# Patient Record
Sex: Male | Born: 1970 | Race: Black or African American | Hispanic: No | Marital: Married | Smoking: Never smoker
Health system: Southern US, Community
[De-identification: ages and names within clinical notes are randomized; demographics above are authoritative.]

## PROBLEM LIST (undated history)

## (undated) DIAGNOSIS — I10 Essential (primary) hypertension: Secondary | ICD-10-CM

## (undated) DIAGNOSIS — J45909 Unspecified asthma, uncomplicated: Secondary | ICD-10-CM

## (undated) HISTORY — PX: FRACTURE SURGERY: SHX138

## (undated) HISTORY — DX: Essential (primary) hypertension: I10

## (undated) HISTORY — PX: INGUINAL HERNIA REPAIR: SUR1180

---

## 2004-09-22 ENCOUNTER — Emergency Department: Payer: Self-pay | Admitting: Emergency Medicine

## 2005-10-03 ENCOUNTER — Emergency Department: Payer: Self-pay | Admitting: Plastic Surgery

## 2005-10-23 ENCOUNTER — Emergency Department: Payer: Self-pay | Admitting: Unknown Physician Specialty

## 2005-11-06 ENCOUNTER — Ambulatory Visit: Payer: Self-pay | Admitting: Unknown Physician Specialty

## 2006-12-23 ENCOUNTER — Emergency Department: Payer: Self-pay | Admitting: General Practice

## 2007-08-17 IMAGING — CT CT ABD-PELV W/O CM
1 of 2 series · 16 of 32 positions shown, 20 images · non-contrast
Comparison: none

REASON FOR EXAM: RLQ PAIN
COMMENTS:

PROCEDURE:     CT  - CT ABDOMEN AND PELVIS W[DATE] [DATE]
RESULT:
HISTORY: RIGHT lower quadrant pain.

[Series 2: ab wo · axial · 0.82mm/px · z∈[-439,-34]mm · 16 of 149 slices shown, 20 images]
[im 7/149  soft-tissue]
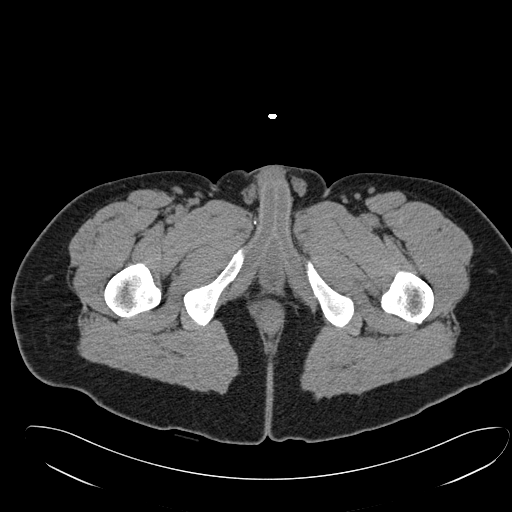
[im 7/149  bone]
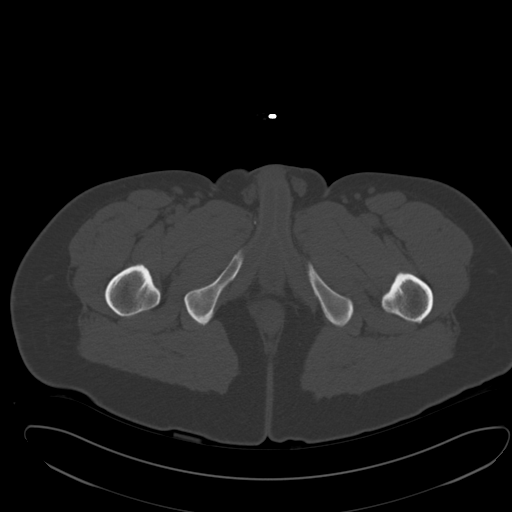
[im 19/149  soft-tissue]
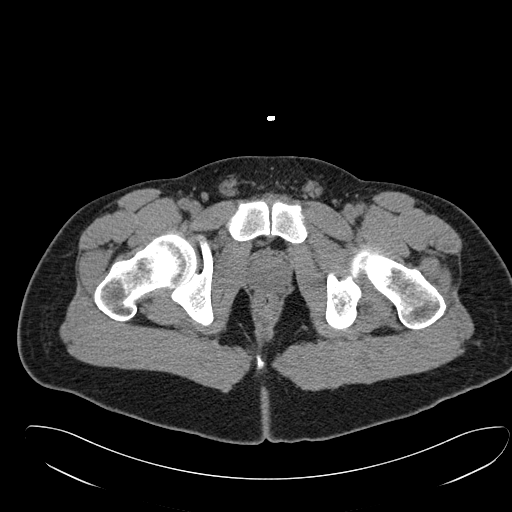
[im 31/149  soft-tissue]
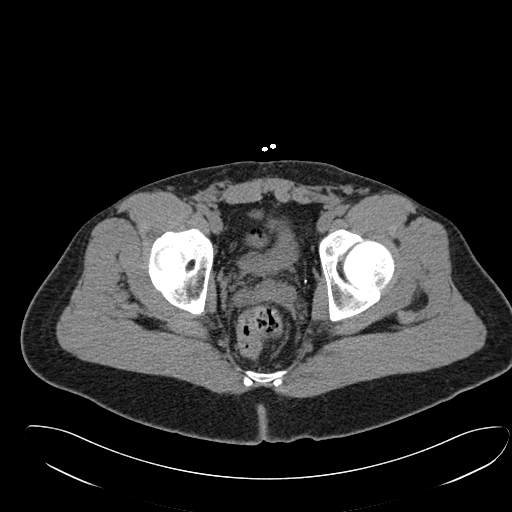
[im 38/149  soft-tissue]
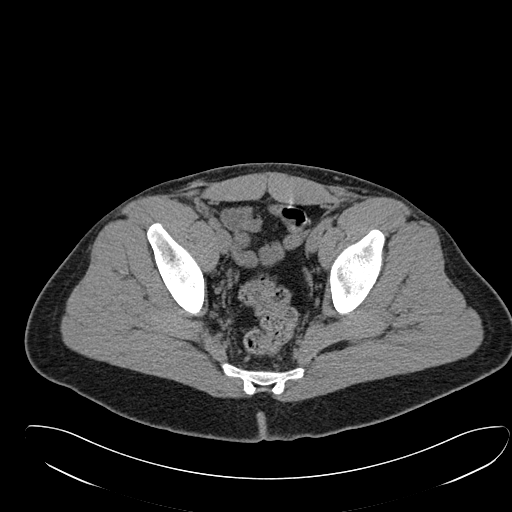
[im 50/149  soft-tissue]
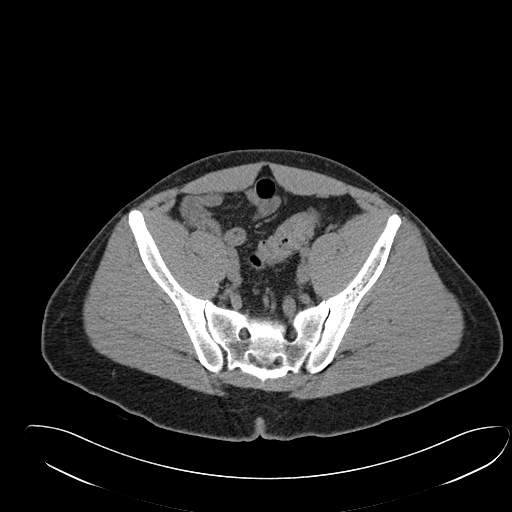
[im 62/149  soft-tissue]
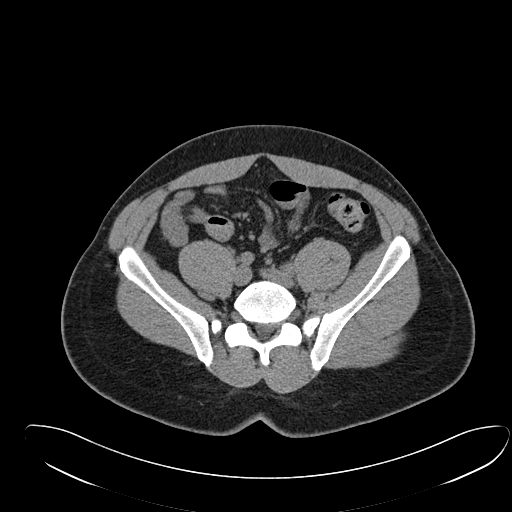
[im 68/149  soft-tissue]
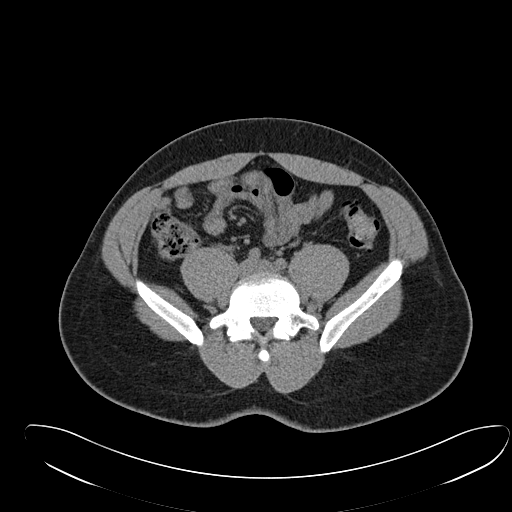
[im 81/149  soft-tissue]
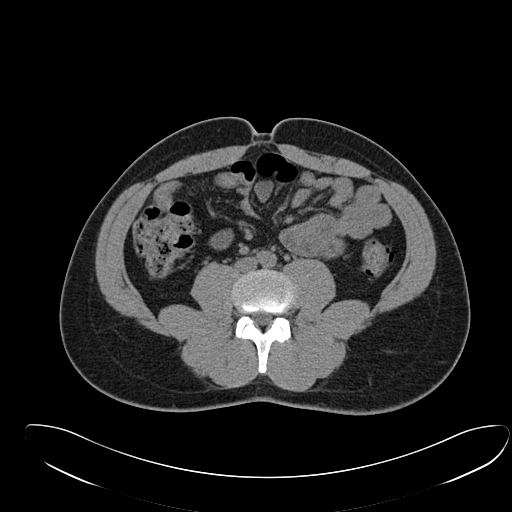
[im 87/149  soft-tissue]
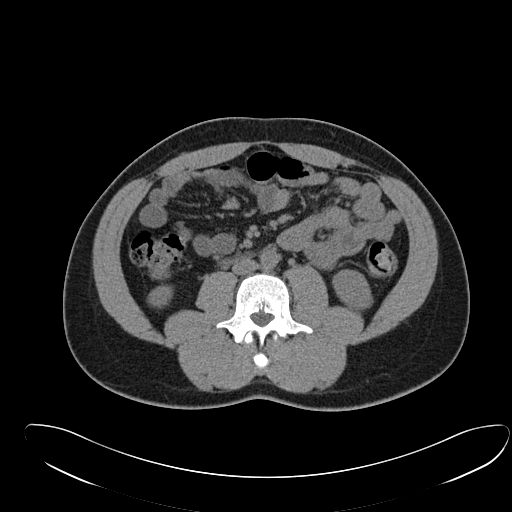
[im 87/149  bone]
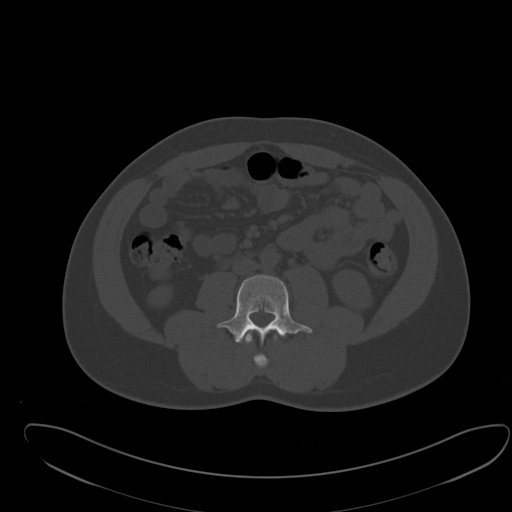
[im 99/149  soft-tissue]
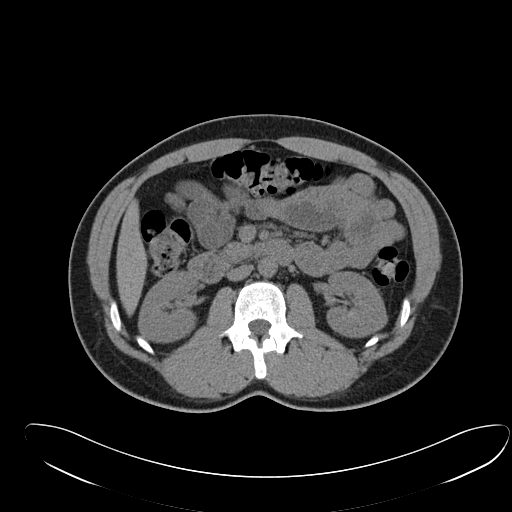
[im 112/149  soft-tissue]
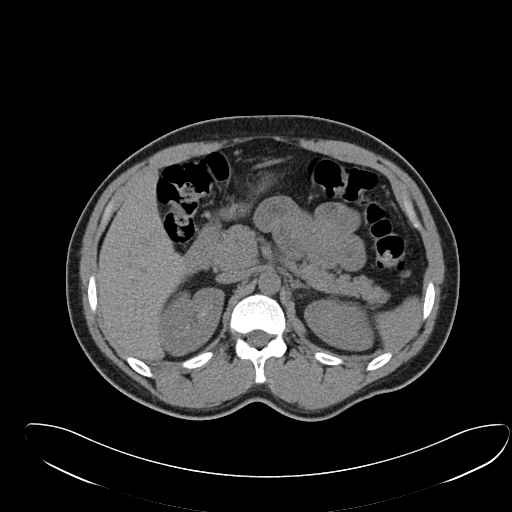
[im 118/149  soft-tissue]
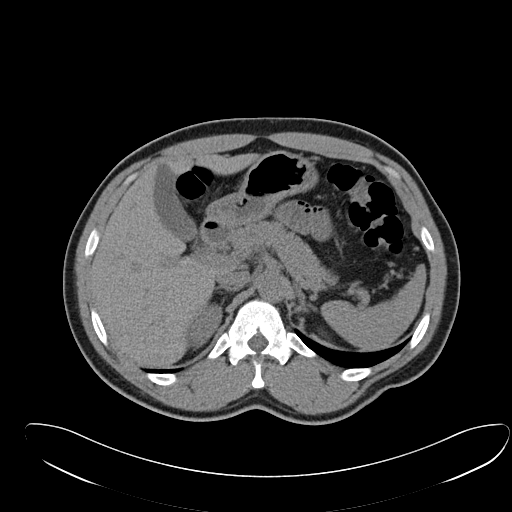
[im 124/149  lung]
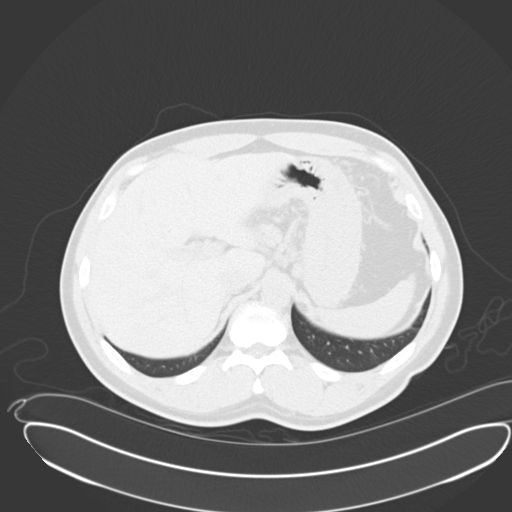
[im 130/149  soft-tissue]
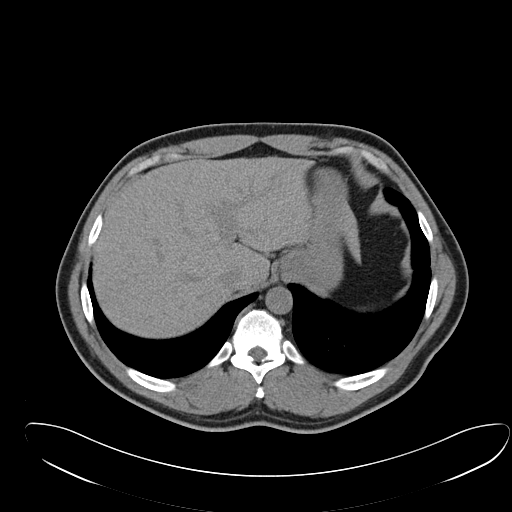
[im 130/149  lung]
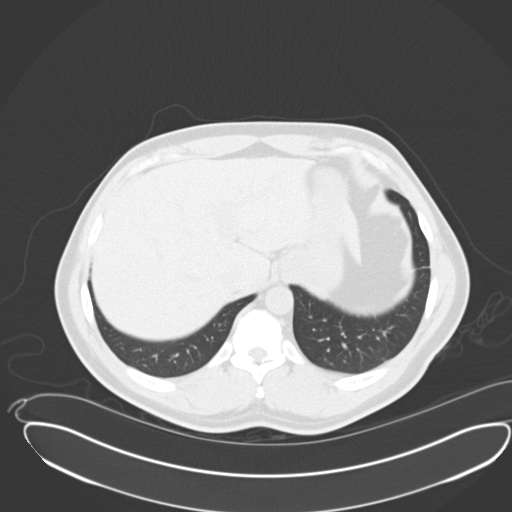
[im 136/149  lung]
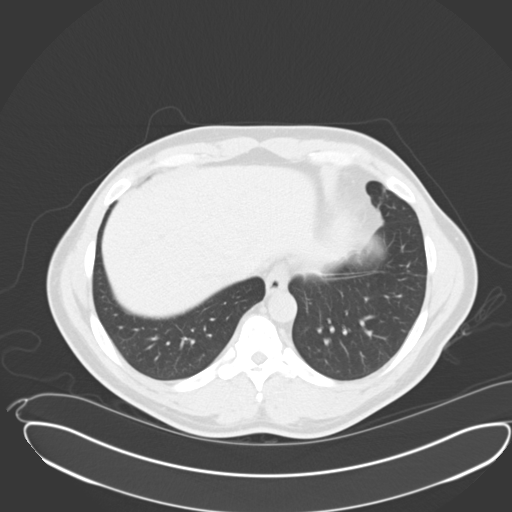
[im 142/149  soft-tissue]
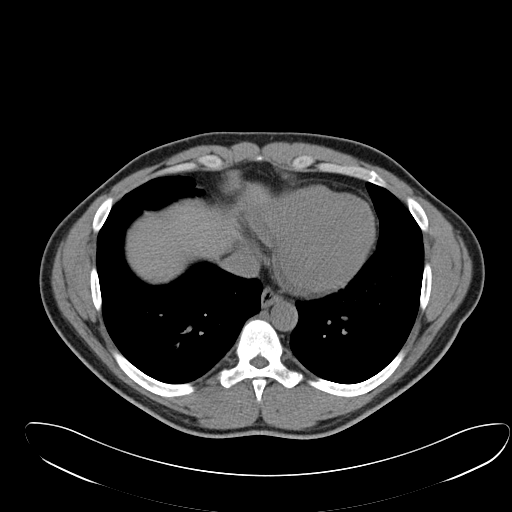
[im 142/149  lung]
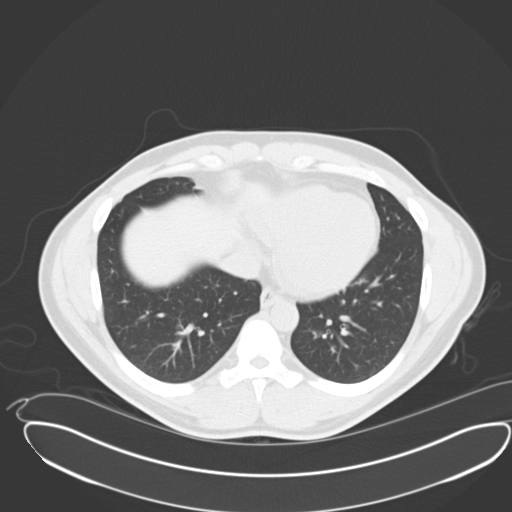

[16 of 32 positions shown; findings below may reference images not displayed]

FINDINGS: The liver and spleen are normal.  The pancreas is normal.  The
adrenals are normal.  Bilateral renal medullary mild calcification is noted.
 LEFT renal nonobstructing calculus is noted. No evidence of ureteral stone.
 The abdominal aorta is normal.  RIGHT lower quadrant is unremarkable.  No
inguinal adenopathy is noted. Calcified pelvic phleboliths are noted.  The
bladder is nondistended.  The lung bases are clear. The appendix is not well
visualized.  The RIGHT lower quadrant, however, is unremarkable.
IMPRESSION: Nonspecific exam.

## 2008-05-22 ENCOUNTER — Emergency Department: Payer: Self-pay | Admitting: Emergency Medicine

## 2009-01-14 IMAGING — CR DG CHEST 2V
1 series · 2 of 2 positions shown · non-contrast
Comparison: none

REASON FOR EXAM: Chest Pain
COMMENTS:

PROCEDURE:     DXR - DXR CHEST PA (OR AP) AND LATERAL  - May 22, 2008 [DATE]
RESULT:     The lung fields are clear. The heart, mediastinal and osseous
structures show no acute changes.

[Series 1: view not recorded · 0.17mm/px · 2 of 2 slices shown]
[im 1/2]
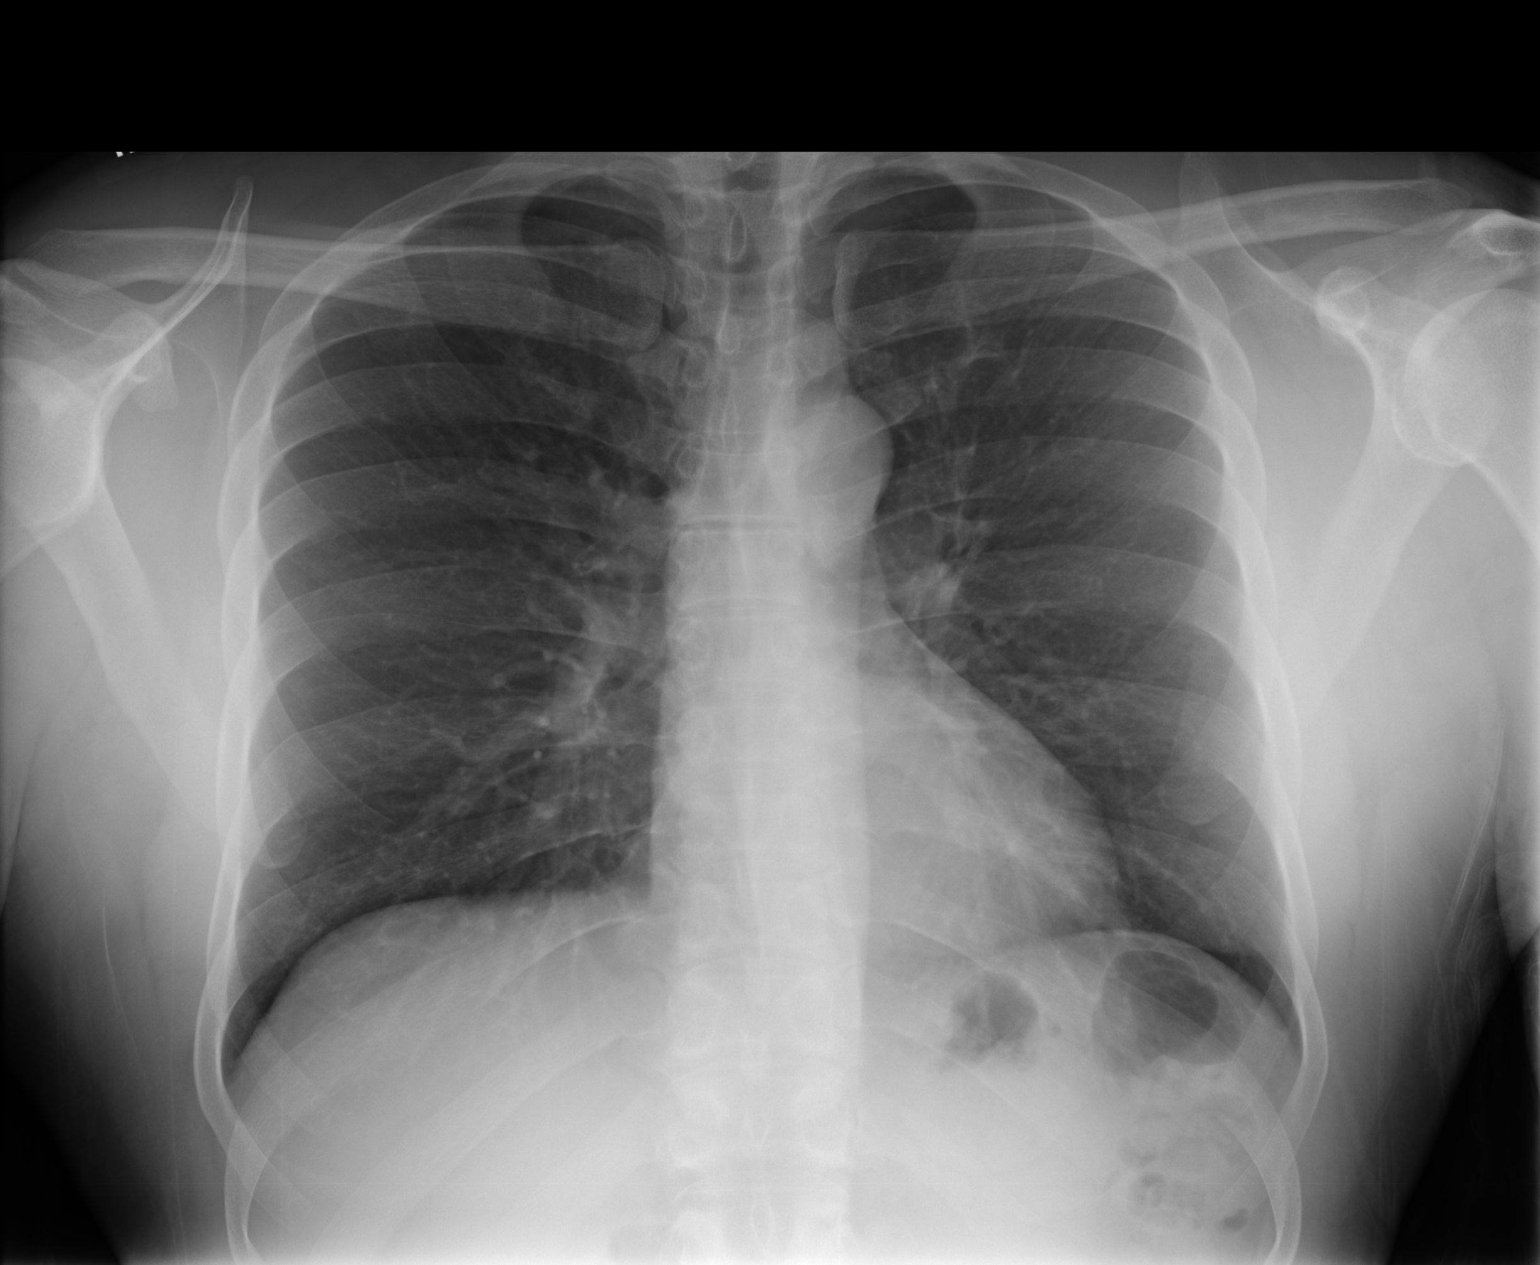
[im 2/2]
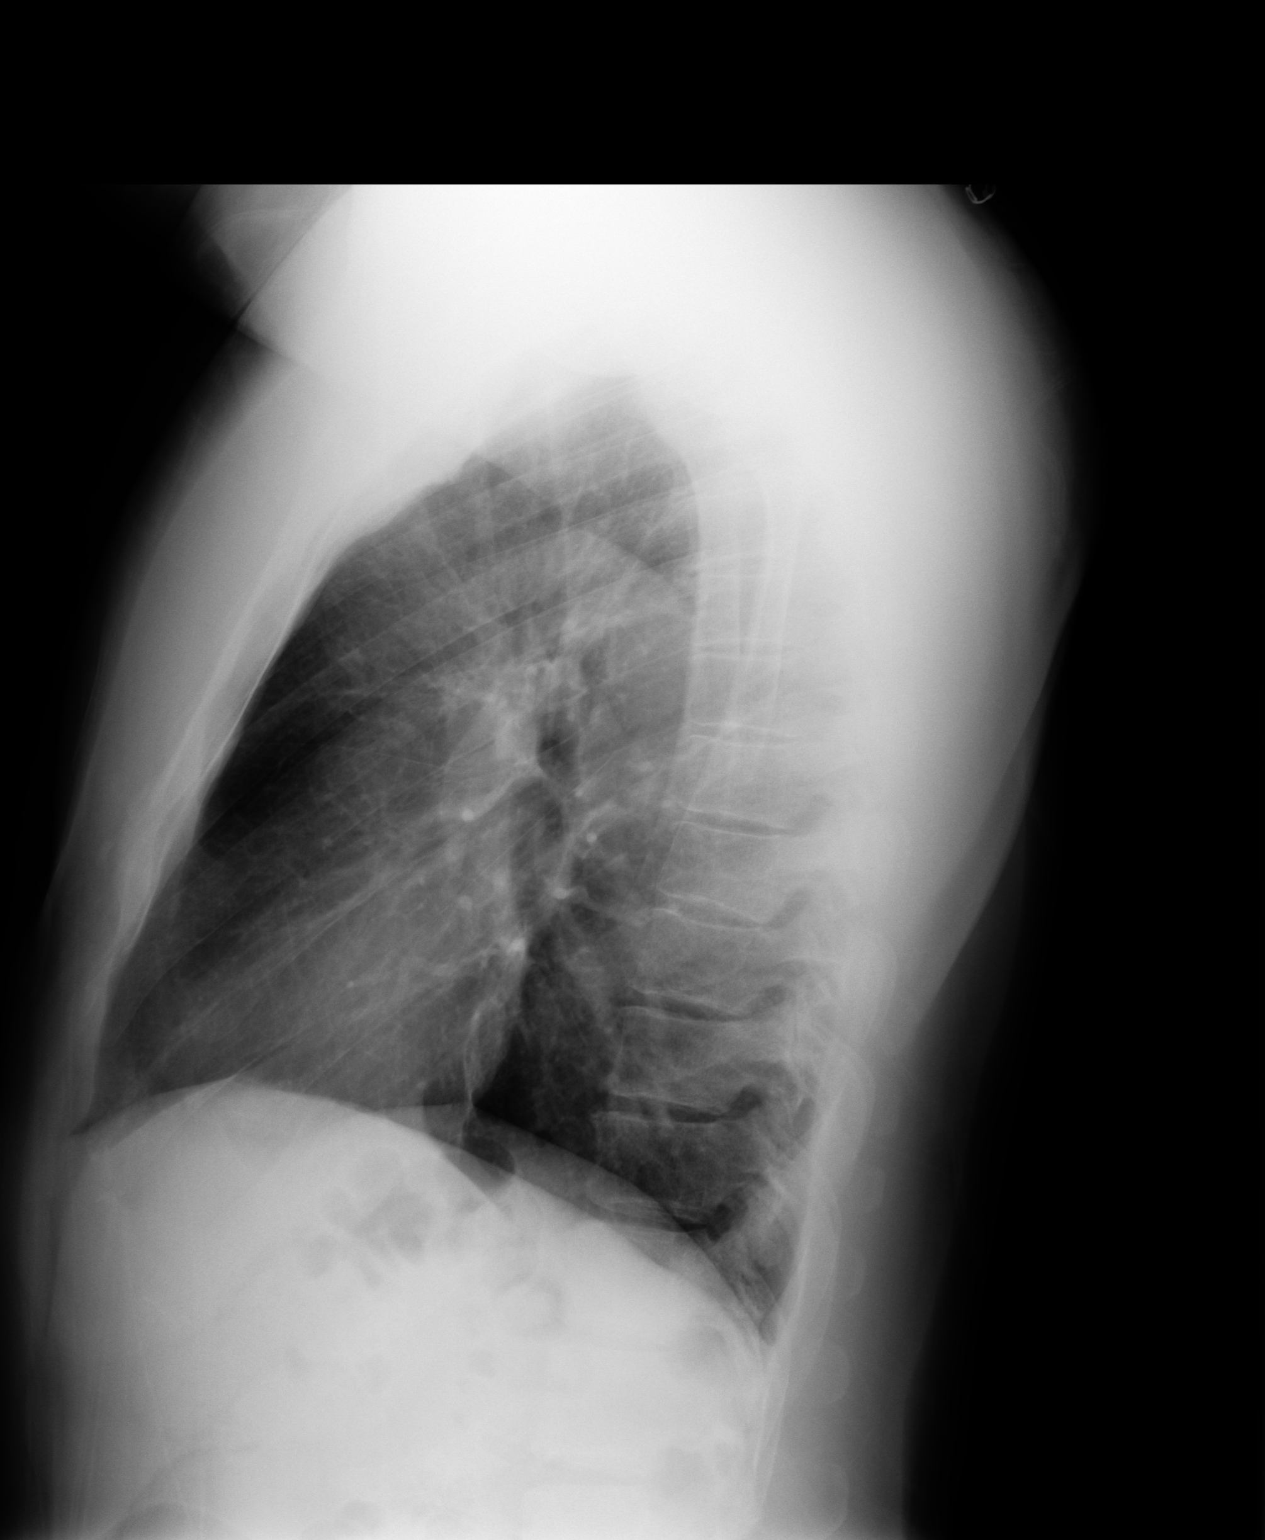

[2 of 2 positions shown; findings below may reference images not displayed]

IMPRESSION: 1.     No acute changes are identified.

## 2019-08-21 ENCOUNTER — Encounter: Payer: Self-pay | Admitting: Emergency Medicine

## 2019-08-21 ENCOUNTER — Emergency Department
Admission: EM | Admit: 2019-08-21 | Discharge: 2019-08-21 | Disposition: A | Discharge status: Home / Self Care | Payer: BLUE CROSS/BLUE SHIELD | Attending: Emergency Medicine | Admitting: Emergency Medicine

## 2019-08-21 DIAGNOSIS — I10 Essential (primary) hypertension: Secondary | ICD-10-CM | POA: Insufficient documentation

## 2019-08-21 DIAGNOSIS — Z79899 Other long term (current) drug therapy: Secondary | ICD-10-CM | POA: Insufficient documentation

## 2019-08-21 LAB — BASIC METABOLIC PANEL WITH GFR
Anion gap: 6 (ref 5–15)
BUN: 14 mg/dL (ref 6–20)
CO2: 27 mmol/L (ref 22–32)
Calcium: 9 mg/dL (ref 8.9–10.3)
Chloride: 105 mmol/L (ref 98–111)
Creatinine, Ser: 0.96 mg/dL (ref 0.61–1.24)
GFR calc Af Amer: >60 mL/min
GFR calc non Af Amer: >60 mL/min
Glucose, Bld: 88 mg/dL (ref 70–99)
Potassium: 3.6 mmol/L (ref 3.5–5.1)
Sodium: 138 mmol/L (ref 135–145)

## 2019-08-21 LAB — CBC
HCT: 43.4 % (ref 39.0–52.0)
Hemoglobin: 14.6 g/dL (ref 13.0–17.0)
MCH: 28.8 pg (ref 26.0–34.0)
MCHC: 33.6 g/dL (ref 30.0–36.0)
MCV: 85.6 fL (ref 80.0–100.0)
Platelets: 269 K/uL (ref 150–400)
RBC: 5.07 MIL/uL (ref 4.22–5.81)
RDW: 13.3 % (ref 11.5–15.5)
WBC: 7 K/uL (ref 4.0–10.5)
nRBC: 0 % (ref 0.0–0.2)

## 2019-08-21 LAB — TROPONIN I (HIGH SENSITIVITY): Troponin I (High Sensitivity): <2 ng/L

## 2019-08-21 MED ORDER — AMLODIPINE BESYLATE 5 MG PO TABS: 5.0000 mg | ORAL_TABLET | Freq: Every day | ORAL | 2 refills | Status: AC

## 2019-08-21 MED ORDER — AMLODIPINE BESYLATE 5 MG PO TABS
10.0000 mg | ORAL_TABLET | Freq: Once | ORAL | Status: AC
  Administered 2019-08-21: 20:00:00 10 mg via ORAL
  Filled 2019-08-21: qty 2

## 2019-08-21 NOTE — Discharge Instructions (Signed)
You are being treated for blood pressure elevation, without a previous diagnosis of hypertension. Take the Amlodipine (2 tabs daily) as directed. Follow-up with [facility] for further blood pressure management. Drink fluids an avoid OTC anti-inflammatories and decongestants. Monitor your BP daily to follow the benefit of the medicine. Return as needed.

## 2019-08-21 NOTE — ED Triage Notes (Signed)
First RN Note: Pt presents to ED via [facility] with c/o HA/dizziness/HTN. Per provider at [facility] pt went to the dentist earlier today, came to ED due to increased BP initially 165/110 with associated HA and dizziness and no documented hx of HTN. Per EMS pt A&O x4.  163/103 90 NSR CBG 123   18G to L AC

## 2019-08-21 NOTE — ED Provider Notes (Signed)
[facility] Emergency Department Provider Note ____________________________________________  Time seen: 1947  I have reviewed the triage vital signs and the nursing notes.  HISTORY  Chief Complaint  Hypertension  HPI Barry Shellie Goettl. is a 49 y.o. male presents himself to the ED via EMS from a local urgent care.  Patient had a dental appointment this morning, but was unable to have his surgery started due to elevated blood pressure readings.  Patient presented subsequently to [facility], and continued to have elevated blood pressure readings, without a diagnosis of hypertension.  Patient presents from the urgent care for evaluation of elevated blood pressure he is also describing episodes of persistent intermittent headaches, and intermittent dizziness.  Patient denies any chest pain, shortness of breath, weakness, paresthesias, or diaphoresis.  He has been taking BC powders and Tylenol for his persistent headaches.  Patient would describe that over the last 6 to 12 months, he has had a daily headache, that is his baseline.  He denies any history of migraines, sinusitis, or stroke.  He does give a strong family history of hypertension noting that his father died from chronic kidney disease.  Patient is otherwise healthy and denies any daily medications.   History reviewed. No pertinent past medical history.  There are no problems to display for this patient.   History reviewed. No pertinent surgical history.  Prior to Admission medications   Medication Sig Start Date End Date Taking? Authorizing Provider  amLODipine (NORVASC) 5 MG tablet Take 1 tablet (5 mg total) by mouth daily. 08/21/19 11/19/19  Mert Dietrick, Dannielle Karvonen, PA-C    Allergies Patient has no known allergies.  History reviewed. No pertinent family history.  Social History Social History   Tobacco Use  . Smoking status: Never Smoker  . Smokeless tobacco: Never Used  Substance Use Topics   . Alcohol use: Not on file  . Drug use: Not on file    Review of Systems  Constitutional: Negative for fever. Eyes: Negative for visual changes. ENT: Negative for sore throat. Cardiovascular: Negative for chest pain. Respiratory: Negative for shortness of breath. Gastrointestinal: Negative for abdominal pain, vomiting and diarrhea. Genitourinary: Negative for dysuria. Musculoskeletal: Negative for back pain. Skin: Negative for rash. Neurological: Negative for focal weakness or numbness.  Reports headaches as above. ____________________________________________  PHYSICAL EXAM:  VITAL SIGNS: ED Triage Vitals  Enc Vitals Group     BP 08/21/19 1907 (!) 146/96     Pulse Rate 08/21/19 1907 98     Resp 08/21/19 1907 18     Temp 08/21/19 1907 98.8 F (37.1 C)     Temp Source 08/21/19 1907 Oral     SpO2 08/21/19 1907 99 %     Weight --      Height --      Head Circumference --      Peak Flow --      Pain Score 08/21/19 1935 0     Pain Loc --      Pain Edu? --      Excl. in [place]? --     Constitutional: Alert and oriented. Well appearing and in no distress. Head: Normocephalic and atraumatic. Eyes: Conjunctivae are normal. PERRL. Normal extraocular movements and fundi bilaterally Ears: Canals clear. TMs intact bilaterally. Nose: No congestion/rhinorrhea/epistaxis. Mouth/Throat: Mucous membranes are moist. Neck: Supple. No thyromegaly. Hematological/Lymphatic/Immunological: No cervical lymphadenopathy. Cardiovascular: Normal rate, regular rhythm. Normal distal pulses. Respiratory: Normal respiratory effort. No wheezes/rales/rhonchi. Gastrointestinal: Soft and nontender. No distention. Musculoskeletal:  Nontender with normal range of motion in all extremities.  Neurologic:  Normal gait without ataxia. Normal speech and language. No gross focal neurologic deficits are appreciated. Skin:  Skin is warm, dry and intact. No rash noted. Psychiatric: Mood and affect are normal.  Patient exhibits appropriate insight and judgment. ____________________________________________   LABS (pertinent positives/negatives) Labs Reviewed  BASIC METABOLIC PANEL  CBC  TROPONIN I (HIGH SENSITIVITY)  ____________________________________________  EKG  See EKG report ____________________________________________  PROCEDURES  Norvasc 10 mg PO Procedures ____________________________________________  INITIAL IMPRESSION / ASSESSMENT AND PLAN / ED COURSE  Differential diagnosis includes, but is not limited to, intracranial hemorrhage, meningitis/encephalitis, previous head trauma, cavernous venous thrombosis, tension headache, temporal arteritis, migraine or migraine equivalent, idiopathic intracranial hypertension, and non-specific headache.  Patient with ED evaluation of elevated BP on evaluaton at the dentist office today, and persistent headaches. He was found to have readings 160+/100+. His exam here is benign and labs & EKGs are normal. He has no chest pain and no signs of end-organ involvement. He will be treated with amlodipine for uncontrolled HTN. He is to monitor his BPs and follow-up with [facility] for ongoing care and management.   Barry Jakorian Marengo. was evaluated in Emergency Department on 08/21/2019 for the symptoms described in the history of present illness. He was evaluated in the context of the global COVID-19 pandemic, which necessitated consideration that the patient might be at risk for infection with the SARS-CoV-2 virus that causes COVID-19. Institutional protocols and algorithms that pertain to the evaluation of patients at risk for COVID-19 are in a state of rapid change based on information released by regulatory bodies including the CDC and federal and state organizations. These policies and algorithms were followed during the patient's care in the ED. ____________________________________________  FINAL CLINICAL IMPRESSION(S) / ED DIAGNOSES  Final  diagnoses:  Hypertension, unspecified type      Lissa Hoard, PA-C 08/21/19 2247    Emily Filbert, MD 08/21/19 2302

## 2019-08-21 NOTE — ED Notes (Signed)
Patient denies prior issues with hypertension, states he "has never taken his BP like that before". He was at the dentist earlier today where they got readings around 158/107, 160/102 etc. Patient reports having headaches for about a month

## 2019-08-21 NOTE — ED Triage Notes (Signed)
Pt reports he went to dentist today and was denied service due to elevated BP. Pt seen at minute clinic and was referred to ED for further treatment. Pt has had recent episodes with HA and dizziness. No prior hx.

## 2020-10-22 ENCOUNTER — Other Ambulatory Visit: Payer: Self-pay

## 2020-10-22 ENCOUNTER — Encounter: Payer: Self-pay | Admitting: General Surgery

## 2020-10-22 ENCOUNTER — Ambulatory Visit (INDEPENDENT_AMBULATORY_CARE_PROVIDER_SITE_OTHER): Payer: Self-pay | Admitting: General Surgery

## 2020-10-22 VITALS — BP 142/84 | HR 83 | Temp 99.2°F | Ht 71.0 in | Wt 198.6 lb

## 2020-10-22 DIAGNOSIS — K429 Umbilical hernia without obstruction or gangrene: Secondary | ICD-10-CM

## 2020-10-22 NOTE — H&P (View-Only) (Signed)
Patient ID: Barry Pitcher., male   DOB: 1970-11-02, 50 y.o.   MRN: [id number]  Chief Complaint  Patient presents with  . New Patient (Initial Visit)    Umbilical hernia    HPI Barry Houston. is a 50 y.o. male.   He has been referred by his primary care provider for surgical evaluation of an umbilical hernia.  He states that he first noticed it about 2 months ago.  He is able to reduce it when he lies supine, but he does experience some discomfort throughout the day.  He denies any nausea or vomiting.  No obstipation or other symptoms of bowel obstruction.  He does say that when he has to strain for bowel movement, he applies pressure to the area to prevent it from popping out further and becoming uncomfortable.  He has a history of bilateral inguinal hernia repairs.  He states that he is a physically active individual and thinks that his weightlifting may have contributed to the development of his hernias.  He is interested in surgical repair.   Past Medical History:  Diagnosis Date  . Hypertension     Past Surgical History:  Procedure Laterality Date  . FRACTURE SURGERY    . INGUINAL HERNIA REPAIR Bilateral     Family History  Problem Relation Age of Onset  . Diabetes Mother   . Hyperlipidemia Mother   . Hypertension Father   . Hyperlipidemia Father     Social History Social History   Tobacco Use  . Smoking status: Never Smoker  . Smokeless tobacco: Never Used  Substance Use Topics  . Drug use: Yes    Types: Marijuana    No Known Allergies  Current Outpatient Medications  Medication Sig Dispense Refill  . atorvastatin (LIPITOR) 40 MG tablet Take 40 mg by mouth daily.    Marland Kitchen amLODipine (NORVASC) 5 MG tablet Take 1 tablet (5 mg total) by mouth daily. 30 tablet 2   No current facility-administered medications for this visit.    Review of Systems Review of Systems  All other systems reviewed and are negative. Are discussed in the history of present  illness.  Blood pressure (!) 142/84, pulse 83, temperature 99.2 F (37.3 C), temperature source Oral, height 5\' 11"  (1.803 m), weight 198 lb 9.6 oz (90.1 kg), SpO2 98 %. Body mass index is 27.7 kg/m.  Physical Exam Physical Exam Constitutional:      General: He is not in acute distress.    Appearance: Normal appearance. He is normal weight.  HENT:     Head: Normocephalic and atraumatic.     Nose:     Comments: Covered with a mask    Mouth/Throat:     Comments: Covered with a mask Eyes:     General: No scleral icterus.       Right eye: No discharge.        Left eye: No discharge.  Neck:     Comments: No palpable cervical or supraclavicular lymphadenopathy.  The trachea is midline.  No dominant thyroid masses or thyromegaly appreciated.  The gland moves freely with deglutition. Cardiovascular:     Rate and Rhythm: Normal rate and regular rhythm.     Pulses: Normal pulses.  Pulmonary:     Effort: Pulmonary effort is normal.     Breath sounds: Normal breath sounds.  Abdominal:     General: Bowel sounds are normal.     Palpations: Abdomen is soft.     Hernia: A hernia is  present.     Comments: Reducible umbilical hernia.    Genitourinary:    Comments: Deferred. Musculoskeletal:        General: No swelling, tenderness or deformity.     Right lower leg: No edema.     Left lower leg: No edema.  Skin:    General: Skin is warm and dry.  Neurological:     Mental Status: He is alert and oriented to person, place, and time.  Psychiatric:        Mood and Affect: Mood normal.        Behavior: Behavior normal.     Data Reviewed I reviewed the faxed information from the patient's PCP office.  The relevant data are a description of the hernia and that it is reducible.  Labs included in the data were essentially within normal limits, aside from some hypercholesterolemia.  Assessment This is a 50 year old man with a symptomatic reducible umbilical hernia.  He is interested in  surgical repair.  Plan I have offered him open umbilical hernia repair.  Due to his active lifestyle, I plan to use prosthetic mesh.  The risks of the surgery were discussed with the patient and his wife.  These include, but are not limited to, bleeding, infection, damage to surrounding tissues or structures, mesh failure, hernia recurrence, wound healing problems.  They had the opportunity to ask questions and these were answered.  We will work on getting him scheduled.    Duanne Guess 10/22/2020, 11:50 AM

## 2020-10-22 NOTE — Progress Notes (Signed)
Patient ID: Barry Magan Jr., male   DOB: 04/03/1971, 50 y.o.   MRN: [id number]  Chief Complaint  Patient presents with  . New Patient (Initial Visit)    Umbilical hernia    HPI Barry Duby Jr. is a 50 y.o. male.   He has been referred by his primary care provider for surgical evaluation of an umbilical hernia.  He states that he first noticed it about 2 months ago.  He is able to reduce it when he lies supine, but he does experience some discomfort throughout the day.  He denies any nausea or vomiting.  No obstipation or other symptoms of bowel obstruction.  He does say that when he has to strain for bowel movement, he applies pressure to the area to prevent it from popping out further and becoming uncomfortable.  He has a history of bilateral inguinal hernia repairs.  He states that he is a physically active individual and thinks that his weightlifting may have contributed to the development of his hernias.  He is interested in surgical repair.   Past Medical History:  Diagnosis Date  . Hypertension     Past Surgical History:  Procedure Laterality Date  . FRACTURE SURGERY    . INGUINAL HERNIA REPAIR Bilateral     Family History  Problem Relation Age of Onset  . Diabetes Mother   . Hyperlipidemia Mother   . Hypertension Father   . Hyperlipidemia Father     Social History Social History   Tobacco Use  . Smoking status: Never Smoker  . Smokeless tobacco: Never Used  Substance Use Topics  . Drug use: Yes    Types: Marijuana    No Known Allergies  Current Outpatient Medications  Medication Sig Dispense Refill  . atorvastatin (LIPITOR) 40 MG tablet Take 40 mg by mouth daily.    . amLODipine (NORVASC) 5 MG tablet Take 1 tablet (5 mg total) by mouth daily. 30 tablet 2   No current facility-administered medications for this visit.    Review of Systems Review of Systems  All other systems reviewed and are negative. Are discussed in the history of present  illness.  Blood pressure (!) 142/84, pulse 83, temperature 99.2 F (37.3 C), temperature source Oral, height 5' 11" (1.803 m), weight 198 lb 9.6 oz (90.1 kg), SpO2 98 %. Body mass index is 27.7 kg/m.  Physical Exam Physical Exam Constitutional:      General: He is not in acute distress.    Appearance: Normal appearance. He is normal weight.  HENT:     Head: Normocephalic and atraumatic.     Nose:     Comments: Covered with a mask    Mouth/Throat:     Comments: Covered with a mask Eyes:     General: No scleral icterus.       Right eye: No discharge.        Left eye: No discharge.  Neck:     Comments: No palpable cervical or supraclavicular lymphadenopathy.  The trachea is midline.  No dominant thyroid masses or thyromegaly appreciated.  The gland moves freely with deglutition. Cardiovascular:     Rate and Rhythm: Normal rate and regular rhythm.     Pulses: Normal pulses.  Pulmonary:     Effort: Pulmonary effort is normal.     Breath sounds: Normal breath sounds.  Abdominal:     General: Bowel sounds are normal.     Palpations: Abdomen is soft.     Hernia: A hernia is   present.     Comments: Reducible umbilical hernia.    Genitourinary:    Comments: Deferred. Musculoskeletal:        General: No swelling, tenderness or deformity.     Right lower leg: No edema.     Left lower leg: No edema.  Skin:    General: Skin is warm and dry.  Neurological:     Mental Status: He is alert and oriented to person, place, and time.  Psychiatric:        Mood and Affect: Mood normal.        Behavior: Behavior normal.     Data Reviewed I reviewed the faxed information from the patient's PCP office.  The relevant data are a description of the hernia and that it is reducible.  Labs included in the data were essentially within normal limits, aside from some hypercholesterolemia.  Assessment This is a 50 year old man with a symptomatic reducible umbilical hernia.  He is interested in  surgical repair.  Plan I have offered him open umbilical hernia repair.  Due to his active lifestyle, I plan to use prosthetic mesh.  The risks of the surgery were discussed with the patient and his wife.  These include, but are not limited to, bleeding, infection, damage to surrounding tissues or structures, mesh failure, hernia recurrence, wound healing problems.  They had the opportunity to ask questions and these were answered.  We will work on getting him scheduled.    Duanne Guess 10/22/2020, 11:50 AM

## 2020-10-22 NOTE — Patient Instructions (Addendum)
Our surgery scheduler will call you within 24-48 hours to schedule your surgery. Please have the blue surgery sheet available when speaking with her.    Umbilical Hernia, Adult  A hernia is a bulge of tissue that pushes through an opening between muscles. An umbilical hernia happens in the abdomen, near the belly button (umbilicus). The hernia may contain tissues from the small intestine, large intestine, or fatty tissue covering the intestines (omentum). Umbilical hernias in adults tend to get worse over time, and they require surgical treatment. There are several types of umbilical hernias. You may have:  A hernia located just above or below the umbilicus (indirect hernia). This is the most common type of umbilical hernia in adults.  A hernia that forms through an opening formed by the umbilicus (direct hernia).  A hernia that comes and goes (reducible hernia). A reducible hernia may be visible only when you strain, lift something heavy, or cough. This type of hernia can be pushed back into the abdomen (reduced).  A hernia that traps abdominal tissue inside the hernia (incarcerated hernia). This type of hernia cannot be reduced.  A hernia that cuts off blood flow to the tissues inside the hernia (strangulated hernia). The tissues can start to die if this happens. This type of hernia requires emergency treatment. What are the causes? An umbilical hernia happens when tissue inside the abdomen presses on a weak area of the abdominal muscles. What increases the risk? You may have a greater risk of this condition if you:  Are obese.  Have had several pregnancies.  Have a buildup of fluid inside your abdomen (ascites).  Have had surgery that weakens the abdominal muscles. What are the signs or symptoms? The main symptom of this condition is a painless bulge at or near the belly button. A reducible hernia may be visible only when you strain, lift something heavy, or cough. Other symptoms may  include:  Dull pain.  A feeling of pressure. Symptoms of a strangulated hernia may include:  Pain that gets increasingly worse.  Nausea and vomiting.  Pain when pressing on the hernia.  Skin over the hernia becoming red or purple.  Constipation.  Blood in the stool. How is this diagnosed? This condition may be diagnosed based on:  A physical exam. You may be asked to cough or strain while standing. These actions increase the pressure inside your abdomen and force the hernia through the opening in your muscles. Your health care provider may try to reduce the hernia by pressing on it.  Your symptoms and medical history. How is this treated? Surgery is the only treatment for an umbilical hernia. Surgery for a strangulated hernia is done as soon as possible. If you have a small hernia that is not incarcerated, you may need to lose weight before having surgery. Follow these instructions at home:  Lose weight, if told by your health care provider.  Do not try to push the hernia back in.  Watch your hernia for any changes in color or size. Tell your health care provider if any changes occur.  You may need to avoid activities that increase pressure on your hernia.  Do not lift anything that is heavier than 10 lb (4.5 kg) until your health care provider says that this is safe.  Take over-the-counter and prescription medicines only as told by your health care provider.  Keep all follow-up visits as told by your health care provider. This is important. Contact a health care provider if:  Your hernia gets larger.  Your hernia becomes painful. Get help right away if:  You develop sudden, severe pain near the area of your hernia.  You have pain as well as nausea or vomiting.  You have pain and the skin over your hernia changes color.  You develop a fever. This information is not intended to replace advice given to you by your health care provider. Make sure you discuss any  questions you have with your health care provider. Document Revised: 08/25/2017 Document Reviewed: 01/11/2017 Elsevier Patient Education  2021 [employer].

## 2020-10-23 ENCOUNTER — Telehealth: Payer: Self-pay | Admitting: General Surgery

## 2020-10-23 NOTE — Telephone Encounter (Signed)
Patient has been advised of Pre-Admission date/time, COVID Testing date and Surgery date.  Surgery Date: 11/13/20  Preadmission Testing Date: 11/01/20 (phone 1p-5p) Covid Testing Date: 11/11/20 in person @ 8:50 am  - patient [facility] ([address] [facility])   Patient has been made aware to call [phone], between 1-3:00pm the day before surgery, to find out what time to arrive for surgery.

## 2020-11-01 ENCOUNTER — Other Ambulatory Visit: Payer: 59

## 2020-11-01 ENCOUNTER — Other Ambulatory Visit: Payer: Self-pay

## 2020-11-01 ENCOUNTER — Encounter
Admission: RE | Admit: 2020-11-01 | Discharge: 2020-11-01 | Disposition: A | Discharge status: Home / Self Care | Payer: 59 | Source: Ambulatory Visit | Attending: General Surgery | Admitting: General Surgery

## 2020-11-01 HISTORY — DX: Unspecified asthma, uncomplicated: J45.909

## 2020-11-01 NOTE — Patient Instructions (Addendum)
Your procedure is scheduled on: 11/13/20 Report to DAY SURGERY DEPARTMENT LOCATED ON 2ND FLOOR MEDICAL MALL ENTRANCE. To find out your arrival time please call [phone] between 1PM - 3PM on 11/12/20.  Remember: Instructions that are not followed completely may result in serious medical risk, up to and including death, or upon the discretion of your surgeon and anesthesiologist your surgery may need to be rescheduled.     _X__ 1. Do not eat food after midnight the night before your procedure.                 No gum chewing or hard candies. You may drink clear liquids up to 2 hours                 before you are scheduled to arrive for your surgery- DO not drink clear                 liquids within 2 hours of the start of your surgery.                 Clear Liquids include:  water, apple juice without pulp, clear carbohydrate                 drink such as Clearfast or Gatorade, Black Coffee or Tea (Do not add                 anything to coffee or tea). Diabetics water only  __X__2.  On the morning of surgery brush your teeth with toothpaste and water, you                 may rinse your mouth with mouthwash if you wish.  Do not swallow any              toothpaste of mouthwash.     _X__ 3.  No Alcohol for 24 hours before or after surgery.   _X__ 4.  Do Not Smoke or use e-cigarettes For 24 Hours Prior to Your Surgery.                 Do not use any chewable tobacco products for at least 6 hours prior to                 surgery.  ____  5.  Bring all medications with you on the day of surgery if instructed.   __X__  6.  Notify your doctor if there is any change in your medical condition      (cold, fever, infections).     Do not wear jewelry, make-up, hairpins, clips or nail polish. Do not wear lotions, powders, or perfumes.  Do not shave 48 hours prior to surgery. Men may shave face and neck. Do not bring valuables to the hospital.    [facility] is not responsible for any belongings or  valuables.  Contacts, dentures/partials or body piercings may not be worn into surgery. Bring a case for your contacts, glasses or hearing aids, a denture cup will be supplied. Leave your suitcase in the car. After surgery it may be brought to your room. For patients admitted to the hospital, discharge time is determined by your treatment team.   Patients discharged the day of surgery will not be allowed to drive home.   Please read over the following fact sheets that you were given:   CHG SOAP  __X__ Take these medicines the morning of surgery with A SIP OF WATER:  1. amLODipine (NORVASC) 5 MG tablet  2.   3.   4.  5.  6.  ____ Fleet Enema (as directed)   __X__ Use CHG Soap/SAGE wipes as directed  __X__ Use inhalers on the day of surgery  ____ Stop metformin/Janumet/Farxiga 2 days prior to surgery    ____ Take 1/2 of usual insulin dose the night before surgery. No insulin the morning          of surgery.   ____ Stop Blood Thinners Coumadin/Plavix/Xarelto/Pleta/Pradaxa/Eliquis/Effient/Aspirin  on   Or contact your Surgeon, Cardiologist or Medical Doctor regarding  ability to stop your blood thinners  __X__ Stop Anti-inflammatories 7 days before surgery such as Advil, Ibuprofen, Motrin,  BC or Goodies Powder, Naprosyn, Naproxen, Aleve, Aspirin    __X__ Stop all herbal supplements, fish oil or vitamin E until after surgery.    ____ Bring C-Pap to the hospital.

## 2020-11-04 ENCOUNTER — Other Ambulatory Visit: Payer: Self-pay

## 2020-11-04 ENCOUNTER — Other Ambulatory Visit
Admission: RE | Admit: 2020-11-04 | Discharge: 2020-11-04 | Disposition: A | Discharge status: Home / Self Care | Payer: 59 | Source: Ambulatory Visit | Attending: General Surgery | Admitting: General Surgery

## 2020-11-04 DIAGNOSIS — Z01810 Encounter for preprocedural cardiovascular examination: Secondary | ICD-10-CM | POA: Insufficient documentation

## 2020-11-11 ENCOUNTER — Other Ambulatory Visit: Admission: RE | Admit: 2020-11-11 | Payer: 59 | Source: Ambulatory Visit

## 2020-11-13 ENCOUNTER — Encounter: Payer: Self-pay | Admitting: General Surgery

## 2020-11-13 ENCOUNTER — Other Ambulatory Visit: Payer: Self-pay

## 2020-11-13 ENCOUNTER — Ambulatory Visit: Payer: 59 | Admitting: Anesthesiology

## 2020-11-13 ENCOUNTER — Ambulatory Visit
Admission: RE | Admit: 2020-11-13 | Discharge: 2020-11-13 | Disposition: A | Discharge status: Home / Self Care | Payer: 59 | Attending: General Surgery | Admitting: General Surgery

## 2020-11-13 ENCOUNTER — Encounter
Admission: RE | Disposition: A | Discharge status: Home / Self Care | Payer: Self-pay | Source: Home / Self Care | Attending: General Surgery

## 2020-11-13 DIAGNOSIS — K429 Umbilical hernia without obstruction or gangrene: Secondary | ICD-10-CM | POA: Insufficient documentation

## 2020-11-13 DIAGNOSIS — Z79899 Other long term (current) drug therapy: Secondary | ICD-10-CM | POA: Diagnosis not present

## 2020-11-13 HISTORY — PX: UMBILICAL HERNIA REPAIR: SHX196

## 2020-11-13 SURGERY — REPAIR, HERNIA, UMBILICAL, ADULT
Anesthesia: General

## 2020-11-13 MED ORDER — FENTANYL CITRATE (PF) 100 MCG/2ML IJ SOLN
INTRAMUSCULAR | Status: AC
  Filled 2020-11-13: qty 2

## 2020-11-13 MED ORDER — FAMOTIDINE 20 MG PO TABS
ORAL_TABLET | ORAL | Status: AC
  Administered 2020-11-13: 20 mg via ORAL
  Filled 2020-11-13: qty 1

## 2020-11-13 MED ORDER — GABAPENTIN 300 MG PO CAPS
ORAL_CAPSULE | ORAL | Status: AC
  Administered 2020-11-13: 300 mg via ORAL
  Filled 2020-11-13: qty 1

## 2020-11-13 MED ORDER — LIDOCAINE-EPINEPHRINE 1 %-1:100000 IJ SOLN
INTRAMUSCULAR | Status: DC | PRN
  Administered 2020-11-13: 5 mL

## 2020-11-13 MED ORDER — ONDANSETRON HCL 4 MG/2ML IJ SOLN: 4.0000 mg | Freq: Once | INTRAMUSCULAR | Status: DC | PRN

## 2020-11-13 MED ORDER — MIDAZOLAM HCL 2 MG/2ML IJ SOLN
INTRAMUSCULAR | Status: DC | PRN
  Administered 2020-11-13: 2 mg via INTRAVENOUS

## 2020-11-13 MED ORDER — CEFAZOLIN SODIUM-DEXTROSE 2-4 GM/100ML-% IV SOLN
2.0000 g | INTRAVENOUS | Status: AC
  Administered 2020-11-13: 2 g via INTRAVENOUS

## 2020-11-13 MED ORDER — CHLORHEXIDINE GLUCONATE 0.12 % MT SOLN: 15.0000 mL | Freq: Once | OROMUCOSAL | Status: AC

## 2020-11-13 MED ORDER — CELECOXIB 200 MG PO CAPS
ORAL_CAPSULE | ORAL | Status: AC
  Administered 2020-11-13: 200 mg via ORAL
  Filled 2020-11-13: qty 1

## 2020-11-13 MED ORDER — HYDROCODONE-ACETAMINOPHEN 5-325 MG PO TABS: 1.0000 | ORAL_TABLET | Freq: Four times a day (QID) | ORAL | 0 refills | Status: DC | PRN

## 2020-11-13 MED ORDER — CEFAZOLIN SODIUM-DEXTROSE 2-4 GM/100ML-% IV SOLN
INTRAVENOUS | Status: AC
  Filled 2020-11-13: qty 100

## 2020-11-13 MED ORDER — LACTATED RINGERS IV SOLN: INTRAVENOUS | Status: DC

## 2020-11-13 MED ORDER — PROPOFOL 10 MG/ML IV BOLUS
INTRAVENOUS | Status: DC | PRN
  Administered 2020-11-13: 200 mg via INTRAVENOUS

## 2020-11-13 MED ORDER — BUPIVACAINE LIPOSOME 1.3 % IJ SUSP
INTRAMUSCULAR | Status: AC
  Filled 2020-11-13: qty 20

## 2020-11-13 MED ORDER — SUGAMMADEX SODIUM 200 MG/2ML IV SOLN
INTRAVENOUS | Status: DC | PRN
  Administered 2020-11-13: 350 mg via INTRAVENOUS

## 2020-11-13 MED ORDER — DEXMEDETOMIDINE HCL 200 MCG/2ML IV SOLN
INTRAVENOUS | Status: DC | PRN
  Administered 2020-11-13: 8 ug via INTRAVENOUS
  Administered 2020-11-13: 12 ug via INTRAVENOUS

## 2020-11-13 MED ORDER — BUPIVACAINE HCL (PF) 0.25 % IJ SOLN
INTRAMUSCULAR | Status: AC
  Filled 2020-11-13: qty 30

## 2020-11-13 MED ORDER — BUPIVACAINE HCL (PF) 0.25 % IJ SOLN
INTRAMUSCULAR | Status: DC | PRN
  Administered 2020-11-13: 5 mL

## 2020-11-13 MED ORDER — FAMOTIDINE 20 MG PO TABS: 20.0000 mg | ORAL_TABLET | Freq: Once | ORAL | Status: AC

## 2020-11-13 MED ORDER — CELECOXIB 200 MG PO CAPS: 200.0000 mg | ORAL_CAPSULE | ORAL | Status: AC

## 2020-11-13 MED ORDER — PROPOFOL 10 MG/ML IV BOLUS
INTRAVENOUS | Status: AC
  Filled 2020-11-13: qty 20

## 2020-11-13 MED ORDER — IBUPROFEN 800 MG PO TABS: 800.0000 mg | ORAL_TABLET | Freq: Three times a day (TID) | ORAL | 0 refills | Status: DC | PRN

## 2020-11-13 MED ORDER — BUPIVACAINE LIPOSOME 1.3 % IJ SUSP
INTRAMUSCULAR | Status: DC | PRN
  Administered 2020-11-13: 20 mL

## 2020-11-13 MED ORDER — FENTANYL CITRATE (PF) 100 MCG/2ML IJ SOLN
INTRAMUSCULAR | Status: DC | PRN
  Administered 2020-11-13 (×2): 50 ug via INTRAVENOUS
  Administered 2020-11-13: 100 ug via INTRAVENOUS

## 2020-11-13 MED ORDER — ORAL CARE MOUTH RINSE: 15.0000 mL | Freq: Once | OROMUCOSAL | Status: AC

## 2020-11-13 MED ORDER — GABAPENTIN 300 MG PO CAPS
300.0000 mg | ORAL_CAPSULE | ORAL | Status: AC
Start: 2020-11-13 — End: 2020-11-13

## 2020-11-13 MED ORDER — MIDAZOLAM HCL 2 MG/2ML IJ SOLN
INTRAMUSCULAR | Status: AC
  Filled 2020-11-13: qty 2

## 2020-11-13 MED ORDER — CHLORHEXIDINE GLUCONATE CLOTH 2 % EX PADS: 6.0000 | MEDICATED_PAD | Freq: Once | CUTANEOUS | Status: DC

## 2020-11-13 MED ORDER — LIDOCAINE-EPINEPHRINE 1 %-1:100000 IJ SOLN
INTRAMUSCULAR | Status: AC
  Filled 2020-11-13: qty 1

## 2020-11-13 MED ORDER — FENTANYL CITRATE (PF) 100 MCG/2ML IJ SOLN: 25.0000 ug | INTRAMUSCULAR | Status: DC | PRN

## 2020-11-13 MED ORDER — LIDOCAINE HCL (CARDIAC) PF 100 MG/5ML IV SOSY
PREFILLED_SYRINGE | INTRAVENOUS | Status: DC | PRN
  Administered 2020-11-13: 100 mg via INTRAVENOUS

## 2020-11-13 MED ORDER — ROCURONIUM BROMIDE 100 MG/10ML IV SOLN
INTRAVENOUS | Status: DC | PRN
  Administered 2020-11-13: 50 mg via INTRAVENOUS

## 2020-11-13 MED ORDER — CHLORHEXIDINE GLUCONATE 0.12 % MT SOLN
OROMUCOSAL | Status: AC
  Administered 2020-11-13: 15 mL via OROMUCOSAL
  Filled 2020-11-13: qty 15

## 2020-11-13 MED ORDER — BUPIVACAINE LIPOSOME 1.3 % IJ SUSP: 20.0000 mL | Freq: Once | INTRAMUSCULAR | Status: DC

## 2020-11-13 MED ORDER — ACETAMINOPHEN 500 MG PO TABS
1000.0000 mg | ORAL_TABLET | ORAL | Status: AC
Start: 2020-11-13 — End: 2020-11-13

## 2020-11-13 MED ORDER — ACETAMINOPHEN 500 MG PO TABS
ORAL_TABLET | ORAL | Status: AC
  Administered 2020-11-13: 1000 mg via ORAL
  Filled 2020-11-13: qty 2

## 2020-11-13 MED ORDER — LACTATED RINGERS IV SOLN: INTRAVENOUS | Status: DC | PRN

## 2020-11-13 MED ORDER — DEXAMETHASONE SODIUM PHOSPHATE 10 MG/ML IJ SOLN
INTRAMUSCULAR | Status: DC | PRN
  Administered 2020-11-13: 10 mg via INTRAVENOUS

## 2020-11-13 SURGICAL SUPPLY — 36 items
ADH SKN CLS APL DERMABOND .7 (GAUZE/BANDAGES/DRESSINGS) ×1
APL PRP STRL LF DISP 70% ISPRP (MISCELLANEOUS) ×1
BLADE SURG 15 STRL LF DISP TIS (BLADE) ×1 IMPLANT
BLADE SURG 15 STRL SS (BLADE) ×2
CANISTER SUCT 1200ML W/VALVE (MISCELLANEOUS) ×2 IMPLANT
CHLORAPREP W/TINT 26 (MISCELLANEOUS) ×2 IMPLANT
COVER WAND RF STERILE (DRAPES) ×2 IMPLANT
DERMABOND ADVANCED (GAUZE/BANDAGES/DRESSINGS) ×1
DERMABOND ADVANCED .7 DNX12 (GAUZE/BANDAGES/DRESSINGS) ×1 IMPLANT
DRAPE LAPAROTOMY 77X122 PED (DRAPES) ×2 IMPLANT
ELECT CAUTERY BLADE TIP 2.5 (TIP) ×2
ELECT REM PT RETURN 9FT ADLT (ELECTROSURGICAL) ×2
ELECTRODE CAUTERY BLDE TIP 2.5 (TIP) ×1 IMPLANT
ELECTRODE REM PT RTRN 9FT ADLT (ELECTROSURGICAL) ×1 IMPLANT
GLOVE SURG ENC MOIS LTX SZ6.5 (GLOVE) ×2 IMPLANT
GLOVE SURG UNDER LTX SZ7 (GLOVE) ×4 IMPLANT
GOWN STRL REUS W/ TWL LRG LVL3 (GOWN DISPOSABLE) ×2 IMPLANT
GOWN STRL REUS W/TWL LRG LVL3 (GOWN DISPOSABLE) ×4
KIT TURNOVER KIT A (KITS) ×2 IMPLANT
LABEL OR SOLS (LABEL) ×2 IMPLANT
MANIFOLD NEPTUNE II (INSTRUMENTS) ×2 IMPLANT
MESH VENTRALEX ST 1-7/10 CRC S (Mesh General) ×2 IMPLANT
NEEDLE HYPO 22GX1.5 SAFETY (NEEDLE) ×4 IMPLANT
NS IRRIG 500ML POUR BTL (IV SOLUTION) ×2 IMPLANT
PACK BASIN MINOR ARMC (MISCELLANEOUS) ×2 IMPLANT
STRIP CLOSURE SKIN 1/2X4 (GAUZE/BANDAGES/DRESSINGS) ×2 IMPLANT
SUT ETHIBOND NAB MO 7 #0 18IN (SUTURE) ×2 IMPLANT
SUT MNCRL 4-0 (SUTURE) ×2
SUT MNCRL 4-0 27XMFL (SUTURE) ×1
SUT VIC AB 3-0 SH 27 (SUTURE) ×2
SUT VIC AB 3-0 SH 27X BRD (SUTURE) ×1 IMPLANT
SUT VICRYL 2-0 SH 8X27 (SUTURE) IMPLANT
SUTURE MNCRL 4-0 27XMF (SUTURE) ×1 IMPLANT
SYR 10ML LL (SYRINGE) ×2 IMPLANT
SYR 20ML LL LF (SYRINGE) ×2 IMPLANT
SYR BULB IRRIG 60ML STRL (SYRINGE) ×2 IMPLANT

## 2020-11-13 NOTE — Anesthesia Procedure Notes (Signed)
Procedure Name: Intubation Performed by: Deri Fuelling, CRNA Pre-anesthesia Checklist: Patient identified, Emergency Drugs available, Suction available and Patient being monitored Patient Re-evaluated:Patient Re-evaluated prior to induction Oxygen Delivery Method: Circle system utilized Preoxygenation: Pre-oxygenation with 100% oxygen Induction Type: IV induction Ventilation: Mask ventilation without difficulty Tube type: Oral Tube size: 7.5 mm Number of attempts: 1 Airway Equipment and Method: Stylet and Oral airway Placement Confirmation: ETT inserted through vocal cords under direct vision,  positive ETCO2 and breath sounds checked- equal and bilateral Tube secured with: Tape Dental Injury: Teeth and Oropharynx as per pre-operative assessment

## 2020-11-13 NOTE — Op Note (Signed)
Umbilical Herniorrhaphy (with mesh) procedure Note  Indications: Symptomatic umbilical hernia  Pre-operative Diagnosis: Reducible umbilical hernia  Post-operative Diagnosis: Reducible umbilical hernia  Surgeon: Duanne Guess   Assistants: None  Anesthesia: General endotracheal anesthesia  Procedure Details  The patient was seen in the Holding Room. The risks, benefits, complications, treatment options, and expected outcomes were discussed with the patient. The possibilities of reaction to medication, pulmonary aspiration, perforation of viscus, bleeding, recurrent infection, the need for additional procedures, failure to diagnose a condition, and creating a complication requiring transfusion or operation were discussed with the patient. The patient concurred with the proposed plan, giving informed consent.  The site of surgery properly noted/marked. The patient was taken to Operating Room # 6, identified as Barry Houston. and the procedure verified as Umbilical Herniorrhaphy. A Time Out was held and the above information confirmed.  The patient was placed supine.  After establishing general anesthesia, the abdomen was prepped and draped in standard fashion.  A one-to-one mixture of 0.25% bupivacaine and 1% lidocaine with epinephrine was injected into the skin and subcutaneous tissues surrounding the hernia.  A periumbilical incision was created.  Dissection was carried down to the hernia sac located above the fascia and was mobilized from surrounding structures.  Intact fascia was identified circumferentially around the defect.  The hernia sac was excised.  The underside of the fascia was cleared of any adherent fat or other tissue.  A 4.3 cm Ventralex ST mesh was then brought to the field.  It was secured to the underside of the fascia using interrupted 0 Ethibond suture.  The fascia was then closed over the mesh with interrupted 0 Ethibond stitches, catching a layer of the mesh with each  stitch to help ensure good mesh adherence.  Liposomal bupivacaine was injected along the fascial planes.  The umbilical stalk was reattached to the fascia.  The soft tissue was irrigated and closed in layers of 3-0 Vicryl.  Hemostasis was confirmed.  The skin incision was closed in layers with a 4-0 Vicryl subcuticular closure.  Dermabond and Steri-Strips were applied at the end of the operation.  Instrument, sponge, and needle counts were correct prior to closure and at the conclusion of the case.   Findings: Hernia sac containing preperitoneal fat  Estimated Blood Loss:  Minimal         Drains: None         Total IV Fluids: See anesthesia record          Specimens: None         Implants: 4.3 cm Ventralex ST mesh         Complications:  None; patient tolerated the procedure well.         Disposition: PACU - hemodynamically stable.         Condition: stable  Attending Attestation: I was present and scrubbed for the entire procedure.

## 2020-11-13 NOTE — Anesthesia Preprocedure Evaluation (Signed)
Anesthesia Evaluation  Patient identified by MRN, date of birth, ID band Patient awake    Reviewed: Allergy & Precautions, H&P , NPO status , Patient's Chart, lab work & pertinent test results, reviewed documented beta blocker date and time   Airway Mallampati: II  TM Distance: >3 FB Neck ROM: full    Dental  (+) Teeth Intact   Pulmonary asthma ,    Pulmonary exam normal        Cardiovascular Exercise Tolerance: Good hypertension, On Medications negative cardio ROS Normal cardiovascular exam Rate:Normal     Neuro/Psych negative neurological ROS  negative psych ROS   GI/Hepatic negative GI ROS, Neg liver ROS,   Endo/Other  negative endocrine ROS  Renal/GU negative Renal ROS  negative genitourinary   Musculoskeletal   Abdominal   Peds  Hematology negative hematology ROS (+)   Anesthesia Other Findings   Reproductive/Obstetrics negative OB ROS                             Anesthesia Physical Anesthesia Plan  ASA: II  Anesthesia Plan: General ETT   Post-op Pain Management:    Induction:   PONV Risk Score and Plan: 3  Airway Management Planned:   Additional Equipment:   Intra-op Plan:   Post-operative Plan:   Informed Consent: I have reviewed the patients History and Physical, chart, labs and discussed the procedure including the risks, benefits and alternatives for the proposed anesthesia with the patient or authorized representative who has indicated his/her understanding and acceptance.       Plan Discussed with: CRNA  Anesthesia Plan Comments: (Admits to marijuana use last July and no use of illicit drugs. ja)        Anesthesia Quick Evaluation

## 2020-11-13 NOTE — Interval H&P Note (Signed)
History and Physical Interval Note:  11/13/2020 10:47 AM  Barry Houston.  has presented today for surgery, with the diagnosis of umbilical hernia.  The various methods of treatment have been discussed with the patient and family. After consideration of risks, benefits and other options for treatment, the patient has consented to  Procedure(s): HERNIA REPAIR UMBILICAL ADULT, open (N/A) as a surgical intervention.  The patient's history has been reviewed, patient examined, no change in status, stable for surgery.  I have reviewed the patient's chart and labs.  Questions were answered to the patient's satisfaction.     Duanne Guess

## 2020-11-13 NOTE — OR Nursing (Signed)
Pt stated smoking mariguana back on July of 2021. Anesthesiologist Pernell Dupre was notified. Urine Drug test was ordered per MD; Pt refused to get the drug test; pt was notified and educated about the risks. MD was notified and awared. MD came to speak to patient. MD agreed to proceed with the surgery without getting the Urine Drug Test. Continue to monitor.

## 2020-11-13 NOTE — Discharge Instructions (Signed)
Open Hernia Repair, Adult, Care After The following information offers guidance on how to care for yourself after your procedure. Your health care provider may also give you more specific instructions. If you have problems or questions, contact your health care provider. What can I expect after the procedure? After the procedure, it is common to have:  Mild discomfort.  Slight bruising.  Minor swelling.  Pain in the abdomen.  A small amount of blood from the incision. Follow these instructions at home: Medicines  Take over-the-counter and prescription medicines only as told by your health care provider.  Ask your health care provider if the medicine prescribed to you: ? Requires you to avoid driving or using machinery. ? Can cause constipation. You may need to take these actions to prevent or treat constipation:  Drink enough fluid to keep your urine pale yellow.  Take over-the-counter or prescription medicines.  Eat foods that are high in fiber, such as beans, whole grains, and fresh fruits and vegetables.  Limit foods that are high in fat and processed sugars, such as fried or sweet foods.  If you were prescribed an antibiotic medicine, take it as told by your health care provider. Do not stop using the antibiotic even if you start to feel better. Incision care  Follow instructions from your health care provider about how to take care of your incision. Make sure you: ? Wash your hands with soap and water for at least 20 seconds before and after you change your bandage (dressing). If soap and water are not available, use hand sanitizer. ? Change your dressing as told by your health care provider. ? Leave stitches (sutures), skin glue, or adhesive strips in place. These skin closures may need to stay in place for 2 weeks or longer. If adhesive strip edges start to loosen and curl up, you may trim the loose edges. Do not remove adhesive strips completely unless your health  care provider tells you to do that.  Check your incision area every day for signs of infection. Check for: ? More redness, swelling, or pain. ? More fluid or blood. ? Warmth. ? Pus or a bad smell.  Wear loose, soft clothing while your incision heals.   Activity  Rest as told by your health care provider.  Do not lift anything that is heavier than 10 lb (4.5 kg), or the limit that you are told, until your health care provider says that it is safe.  Do not play contact sports until your health care provider says that this is safe.  If you were given a sedative during the procedure, it can affect you for several hours. Do not drive or operate machinery until your health care provider says that it is safe.  Return to your normal activities as told by your health care provider. Ask your health care provider what activities are safe for you.   General instructions  Do not take baths, swim, or use a hot tub until your health care provider approves. Ask your health care provider if you may take showers. You may only be allowed to take sponge baths.  Hold a pillow over your abdomen when you cough or sneeze. This helps with pain.  Do not use any products that contain nicotine or tobacco. These products include cigarettes, chewing tobacco, and vaping devices, such as e-cigarettes. If you need help quitting, ask your health care provider.  Keep all follow-up visits. This is important. Contact a health care provider if:  You have any of these signs of infection: ? More redness, swelling, or pain around your incision. ? More fluid or blood coming from your incision. ? Warmth coming from your incision. ? Pus or a bad smell coming from your incision. ? A fever or chills.  You have blood in your stool (feces).  You have not had a bowel movement in 2-3 days.  Your pain is not controlled with medicine. Get help right away if:  You have chest pain or shortness of breath.  You feel faint or  light-headed.  You have severe pain.  You vomit and your pain is worse.  You have pain, swelling, or redness in a leg. These symptoms may represent a serious problem that is an emergency. Do not wait to see if the symptoms will go away. Get medical help right away. Call your local emergency services (911 in the [place] Do not drive yourself to the hospital. Summary  After an open hernia repair, it is common to have mild discomfort, slight bruising, and minor swelling.  Follow instructions from your health care provider about how to take care of your incision. Check every day for signs of infection.  Do not lift heavy objects or play contact sports until your health care provider says it is safe.  Return to your normal activities as told by your health care provider. Ask your health care provider what activities are safe for you. This information is not intended to replace advice given to you by your health care provider. Make sure you discuss any questions you have with your health care provider. Document Revised: 02/26/2020 Document Reviewed: 02/26/2020 Elsevier Patient Education  2021 [employer] AMBULATORY SURGERY  DISCHARGE INSTRUCTIONS   1) The drugs that you were given will stay in your system until tomorrow so for the next 24 hours you should not:  A) Drive an automobile B) Make any legal decisions C) Drink any alcoholic beverage   2) You may resume regular meals tomorrow.  Today it is better to start with liquids and gradually work up to solid foods.  You may eat anything you prefer, but it is better to start with liquids, then soup and crackers, and gradually work up to solid foods.   3) Please notify your doctor immediately if you have any unusual bleeding, trouble breathing, redness and pain at the surgery site, drainage, fever, or pain not relieved by medication.    4) Additional Instructions:  DO NOT REMOVE EXPAREL BAND  (TEAL BRACELET) for 4 DAYS ( 96  hours)        Please contact your physician with any problems or Same Day Surgery at [phone], Monday through Friday 6 am to 4 pm, or [place] at [facility] number at [phone].

## 2020-11-13 NOTE — Transfer of Care (Signed)
Immediate Anesthesia Transfer of Care Note  Patient: Barry Houston.  Procedure(s) Performed: HERNIA REPAIR UMBILICAL ADULT, open with mesh (N/A )  Patient Location: PACU  Anesthesia Type:General  Level of Consciousness: awake  Airway & Oxygen Therapy: Patient Spontanous Breathing  Post-op Assessment: Report given to RN  Post vital signs: Reviewed  Last Vitals:  Vitals Value Taken Time  BP 126/75 11/13/20 1230  Temp 36.7 C 11/13/20 1230  Pulse 81 11/13/20 1234  Resp 13 11/13/20 1234  SpO2 100 % 11/13/20 1234  Vitals shown include unvalidated device data.  Last Pain:  Vitals:   11/13/20 1230  TempSrc:   PainSc: Asleep         Complications: No complications documented.

## 2020-11-13 NOTE — Anesthesia Postprocedure Evaluation (Signed)
Anesthesia Post Note  Patient: Barry Houston.  Procedure(s) Performed: HERNIA REPAIR UMBILICAL ADULT, open with mesh (N/A )  Patient location during evaluation: PACU Anesthesia Type: General Level of consciousness: awake and alert Pain management: pain level controlled Vital Signs Assessment: post-procedure vital signs reviewed and stable Respiratory status: spontaneous breathing, nonlabored ventilation, respiratory function stable and patient connected to nasal cannula oxygen Cardiovascular status: blood pressure returned to baseline and stable Postop Assessment: no apparent nausea or vomiting Anesthetic complications: no   No complications documented.   Last Vitals:  Vitals:   11/13/20 1300 11/13/20 1325  BP: 132/80 140/75  Pulse:  68  Resp:  18  Temp: 36.5 C 36.4 C  SpO2:  95%    Last Pain:  Vitals:   11/13/20 1325  TempSrc: Temporal  PainSc: 0-No pain                 Yevette Edwards

## 2020-11-14 ENCOUNTER — Encounter: Payer: Self-pay | Admitting: General Surgery

## 2020-11-26 ENCOUNTER — Other Ambulatory Visit: Payer: Self-pay

## 2020-11-26 ENCOUNTER — Ambulatory Visit (INDEPENDENT_AMBULATORY_CARE_PROVIDER_SITE_OTHER): Payer: PRIVATE HEALTH INSURANCE | Admitting: General Surgery

## 2020-11-26 ENCOUNTER — Encounter: Payer: Self-pay | Admitting: General Surgery

## 2020-11-26 VITALS — BP 143/88 | HR 76 | Temp 98.9°F | Ht 71.0 in | Wt 198.2 lb

## 2020-11-26 DIAGNOSIS — Z09 Encounter for follow-up examination after completed treatment for conditions other than malignant neoplasm: Secondary | ICD-10-CM

## 2020-11-26 NOTE — Patient Instructions (Signed)
You may massage shea butter or cocoa butter to the scar daily. Please call if you have any questions or concerns.   GENERAL POST-OPERATIVE PATIENT INSTRUCTIONS   WOUND CARE INSTRUCTIONS:  Keep a dry clean dressing on the wound if there is drainage. The initial bandage may be removed after 24 hours.  Once the wound has quit draining you may leave it open to air.  If clothing rubs against the wound or causes irritation and the wound is not draining you may cover it with a dry dressing during the daytime.  Try to keep the wound dry and avoid ointments on the wound unless directed to do so.  If the wound becomes bright red and painful or starts to drain infected material that is not clear, please contact your physician immediately.  If the wound is mildly pink and has a thick firm ridge underneath it, this is normal, and is referred to as a healing ridge.  This will resolve over the next 4-6 weeks.  BATHING: You may shower if you have been informed of this by your surgeon. However, Please do not submerge in a tub, hot tub, or pool until incisions are completely sealed or have been told by your surgeon that you may do so.  DIET:  You may eat any foods that you can tolerate.  It is a good idea to eat a high fiber diet and take in plenty of fluids to prevent constipation.  If you do become constipated you may want to take a mild laxative or take ducolax tablets on a daily basis until your bowel habits are regular.  Constipation can be very uncomfortable, along with straining, after recent surgery.  ACTIVITY:  You are encouraged to cough and deep breath or use your incentive spirometer if you were given one, every 15-30 minutes when awake.  This will help prevent respiratory complications and low grade fevers post-operatively if you had a general anesthetic.  You may want to hug a pillow when coughing and sneezing to add additional support to the surgical area, if you had abdominal or chest surgery, which will  decrease pain during these times.  You are encouraged to walk and engage in light activity for the next two weeks.  You should not lift more than 10 pounds, until  12/25/2020                 as it could put you at increased risk for complications.  Twenty pounds is roughly equivalent to a plastic bag of groceries. At that time- Listen to your body when lifting, if you have pain when lifting, stop and then try again in a few days. Soreness after doing exercises or activities of daily living is normal as you get back in to your normal routine.  MEDICATIONS:  Try to take narcotic medications and anti-inflammatory medications, such as tylenol, ibuprofen, naprosyn, etc., with food.  This will minimize stomach upset from the medication.  Should you develop nausea and vomiting from the pain medication, or develop a rash, please discontinue the medication and contact your physician.  You should not drive, make important decisions, or operate machinery when taking narcotic pain medication.  SUNBLOCK Use sun block to incision area over the next year if this area will be exposed to sun. This helps decrease scarring and will allow you avoid a permanent darkened area over your incision.  QUESTIONS:  Please feel free to call our office if you have any questions, and we will be  glad to assist you. [phone]

## 2020-11-26 NOTE — Progress Notes (Signed)
Barry Houston is here today for a postoperative visit.  He is a 50 year old man who underwent open umbilical hernia repair with mesh on November 13, 2020.  He reports that he has had essentially no pain since his operation.  He denies any fevers or chills.  No nausea or vomiting.  Appetite and bowel function are normal.  He denies any drainage from his incision.  Today's Vitals   11/26/20 0927  BP: (!) 143/88  Pulse: 76  Temp: 98.9 F (37.2 C)  TempSrc: Oral  SpO2: 97%  Weight: 198 lb 3.2 oz (89.9 kg)  Height: 5\' 11"  (1.803 m)   Body mass index is 27.64 kg/m. Focused examination of the umbilical area demonstrates that a Steri-Strip is still in place.  This was removed to reveal a well approximated periumbilical incision with a small amount of dried scabbing material present.  There is no erythema, induration, or purulent drainage.  Impression and plan: This is a 50 year old man who underwent an uncomplicated umbilical hernia repair.  He is doing well.  He should continue to refrain from lifting, pushing, or pulling anything heavier than 10 pounds for a total of 6 weeks from the time of surgery.  He may otherwise resume his usual activities.  I will see him on an as-needed basis.

## 2021-05-01 ENCOUNTER — Encounter: Payer: Self-pay | Admitting: General Surgery
